# Patient Record
Sex: Male | Born: 2000 | Hispanic: Yes | Marital: Single | Smoking: Never smoker
Health system: Southern US, Community
[De-identification: ages and names within clinical notes are randomized; demographics above are authoritative.]

---
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
## 2021-08-14 ENCOUNTER — Encounter: Payer: Self-pay | Admitting: Emergency Medicine

## 2021-08-14 ENCOUNTER — Other Ambulatory Visit: Payer: Self-pay

## 2021-08-14 ENCOUNTER — Ambulatory Visit
Admission: EM | Admit: 2021-08-14 | Discharge: 2021-08-14 | Disposition: A | Discharge status: Home / Self Care | Payer: Medicaid Other | Attending: Internal Medicine | Admitting: Internal Medicine

## 2021-08-14 DIAGNOSIS — M25571 Pain in right ankle and joints of right foot: Secondary | ICD-10-CM | POA: Diagnosis not present

## 2021-08-14 DIAGNOSIS — M25572 Pain in left ankle and joints of left foot: Secondary | ICD-10-CM

## 2021-08-14 MED ORDER — IBUPROFEN 400 MG PO TABS: 400.0000 mg | ORAL_TABLET | Freq: Four times a day (QID) | ORAL | 0 refills | Status: AC | PRN

## 2021-08-14 NOTE — Discharge Instructions (Addendum)
Rest and elevate the affected painful area.   ?Apply cold compresses intermittently as needed.   ?As pain recedes, begin normal activities slowly as tolerated.   ?Gentle stretching exercises.  Please stretch before and after exercising ?Please get sports shoes with good support ?Call if symptoms persist. ? ?

## 2021-08-14 NOTE — ED Triage Notes (Signed)
Pt here with bilateral ankle pain after slipping in December. No swelling or bruising.  ?

## 2021-08-14 NOTE — ED Provider Notes (Signed)
?[facility] ? ? ? ?CSN: [id number] ?Arrival date & time: 08/14/21  1702 ? ? ?  ? ?History   ?Chief Complaint ?Chief Complaint  ?Patient presents with  ? Ankle Pain  ? ? ?HPI ?Curtis Woodard is a 21 y.o. male comes to the urgent care with 1 month history of bilateral ankle pain.  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  About a month ago he apparently sprained his right ankle.  He has been experiencing intermittent bilateral ankle pain over the past month.  Pain is aggravated by vigorous physical activity like playing soccer.  Pain is relieved and resolves after he completed physical vigorous physical activity.  No swelling of the ankle.  No bruising.  No other joint ache/pain.  No skin rash. ? ?HPI ? ?History reviewed. No pertinent past medical history. ? ?There are no problems to display for this patient. ? ? ?History reviewed. No pertinent surgical history. ? ? ? ? ?Home Medications   ? ?Prior to Admission medications   ?Medication Sig Start Date End Date Taking? Authorizing Provider  ?ibuprofen (ADVIL) 400 MG tablet Take 1 tablet (400 mg total) by mouth every 6 (six) hours as needed. 08/14/21  Yes Rhyder Bratz, Britta Mccreedy, MD  ? ? ?Family History ?Family History  ?Family history unknown: Yes  ? ? ?Social History ?Social History  ? ?Tobacco Use  ? Smoking status: Never  ? Smokeless tobacco: Never  ?Vaping Use  ? Vaping Use: Never used  ?Substance Use Topics  ? Alcohol use: Not Currently  ? Drug use: Not Currently  ? ? ? ?Allergies   ?Patient has no known allergies. ? ? ?Review of Systems ?Review of Systems  ?Constitutional: Negative.   ?HENT: Negative.    ?Musculoskeletal:  Positive for arthralgias. Negative for joint swelling, myalgias and neck pain.  ?Skin: Negative.   ?Neurological: Negative.   ? ? ?Physical Exam ?Triage Vital Signs ?ED Triage Vitals  ?Enc Vitals Group  ?   BP 08/14/21 1732 127/80  ?   Pulse Rate 08/14/21 1732 69  ?   Resp 08/14/21 1732 16  ?   Temp  08/14/21 1732 98.5 ?F (36.9 ?C)  ?   Temp Source 08/14/21 1732 Oral  ?   SpO2 08/14/21 1732 98 %  ?   Weight --   ?   Height --   ?   Head Circumference --   ?   Peak Flow --   ?   Pain Score 08/14/21 1706 5  ?   Pain Loc --   ?   Pain Edu? --   ?   Excl. in GC? --   ? ?No data found. ? ?Updated Vital Signs ?BP 127/80 (BP Location: Left Arm)   Pulse 69   Temp 98.5 ?F (36.9 ?C) (Oral)   Resp 16   SpO2 98%  ? ?Visual Acuity ?Right Eye Distance:   ?Left Eye Distance:   ?Bilateral Distance:   ? ?Right Eye Near:   ?Left Eye Near:    ?Bilateral Near:    ? ?Physical Exam ?Vitals and nursing note reviewed.  ?Constitutional:   ?   Appearance: Normal appearance.  ?Cardiovascular:  ?   Rate and Rhythm: Normal rate and regular rhythm.  ?Musculoskeletal:     ?   General: No swelling, tenderness, deformity or signs of injury. Normal range of motion.  ?Skin: ?   General: Skin is warm.  ?  Findings: No bruising.  ?Neurological:  ?   Mental Status: He is alert.  ? ? ? ?UC Treatments / Results  ?Labs ?(all labs ordered are listed, but only abnormal results are displayed) ?Labs Reviewed - No data to display ? ?EKG ? ? ?Radiology ?No results found. ? ?Procedures ?Procedures (including critical care time) ? ?Medications Ordered in UC ?Medications - No data to display ? ?Initial Impression / Assessment and Plan / UC Course  ?I have reviewed the triage vital signs and the nursing notes. ? ?Pertinent labs & imaging results that were available during my care of the patient were reviewed by me and considered in my medical decision making (see chart for details). ? ?  ? ?1.  Bilateral ankle pain: ?Conservative measures as detailed in the discharge instructions ?Gentle stretching exercises before and after playing sports ?Ibuprofen as needed for pain ?Return to urgent care if symptoms worsen. ?Final Clinical Impressions(s) / UC Diagnoses  ? ?Final diagnoses:  ?Bilateral ankle pain, unspecified chronicity  ? ? ? ?Discharge Instructions    ? ?  ?Rest and elevate the affected painful area.   ?Apply cold compresses intermittently as needed.   ?As pain recedes, begin normal activities slowly as tolerated.   ?Gentle stretching exercises.  Please stretch before and after exercising ?Please get sports shoes with good support ?Call if symptoms persist. ? ? ? ?ED Prescriptions   ? ? Medication Sig Dispense Auth. Provider  ? ibuprofen (ADVIL) 400 MG tablet Take 1 tablet (400 mg total) by mouth every 6 (six) hours as needed. 30 tablet Tammatha Cobb, Britta Mccreedy, MD  ? ?  ? ?PDMP not reviewed this encounter. ?  ?Merrilee Jansky, MD ?08/14/21 1829 ? ?

## 2021-12-24 ENCOUNTER — Ambulatory Visit
Admission: EM | Admit: 2021-12-24 | Discharge: 2021-12-24 | Disposition: A | Discharge status: Home / Self Care | Payer: Medicaid Other | Attending: Family Medicine | Admitting: Family Medicine

## 2021-12-24 DIAGNOSIS — A084 Viral intestinal infection, unspecified: Secondary | ICD-10-CM | POA: Diagnosis not present

## 2021-12-24 MED ORDER — ONDANSETRON HCL 4 MG PO TABS: 4.0000 mg | ORAL_TABLET | Freq: Four times a day (QID) | ORAL | 0 refills | Status: AC

## 2021-12-24 NOTE — ED Provider Notes (Signed)
Curtis Woodard    CSN: [id number] Arrival date & time: 12/24/21  1332      History   Chief Complaint Chief Complaint  Patient presents with   Emesis   Diarrhea   Headache    HPI Curtis Woodard is a 21 y.o. male.   HPI Patient presents today for evaluation of 1 day of nausea with vomiting, upset stomach, headache, and today diarrhea.  Denies any known sick contacts.  He has been fever free. He endorses its been unable to eat some things at work for food exacerbates symptoms.  He is tolerating fluids.He did miss work yesterday and needs a work excuse to be able to return back to work.  Denies any URI symptoms.  History reviewed. No pertinent past medical history.  Patient Active Problem List   Diagnosis Date Noted   Abdominal pain 05/12/2017    History reviewed. No pertinent surgical history.     Home Medications    Prior to Admission medications   Medication Sig Start Date End Date Taking? Authorizing Provider  ondansetron (ZOFRAN) 4 MG tablet Take 1 tablet (4 mg total) by mouth every 6 (six) hours. 12/24/21  Yes Bing Neighbors, FNP  ibuprofen (ADVIL) 400 MG tablet Take 1 tablet (400 mg total) by mouth every 6 (six) hours as needed. 08/14/21   Lamptey, Britta Mccreedy, MD    Family History Family History  Family history unknown: Yes    Social History Social History   Tobacco Use   Smoking status: Never   Smokeless tobacco: Never  Vaping Use   Vaping Use: Never used  Substance Use Topics   Alcohol use: Not Currently   Drug use: Not Currently     Allergies   Patient has no known allergies.  Review of Systems Review of Systems Pertinent negatives listed in HPI   Physical Exam Triage Vital Signs ED Triage Vitals  Enc Vitals Group     BP 12/24/21 1342 118/76     Pulse Rate 12/24/21 1342 76     Resp 12/24/21 1342 16     Temp 12/24/21 1342 97.8 F (36.6 C)     Temp Source 12/24/21 1342 Temporal     SpO2 12/24/21 1342 95 %     Weight  --      Height --      Head Circumference --      Peak Flow --      Pain Score 12/24/21 1341 6     Pain Loc --      Pain Edu? --      Excl. in [place]? --    No data found.  Updated Vital Signs BP 118/76 (BP Location: Left Arm)   Pulse 76   Temp 97.8 F (36.6 C) (Temporal)   Resp 16   SpO2 95%   Visual Acuity Right Eye Distance:   Left Eye Distance:   Bilateral Distance:    Right Eye Near:   Left Eye Near:    Bilateral Near:     Physical Exam Vitals reviewed.  Constitutional:      Appearance: He is well-developed.  HENT:     Head: Normocephalic and atraumatic.     Nose: Nose normal.  Eyes:     Extraocular Movements: Extraocular movements intact.     Pupils: Pupils are equal, round, and reactive to light.  Cardiovascular:     Rate and Rhythm: Normal rate and regular rhythm.  Pulmonary:     Effort: Pulmonary  effort is normal.     Breath sounds: Normal breath sounds.  Abdominal:     General: Bowel sounds are increased. There is no distension.     Palpations: Abdomen is soft.     Tenderness: There is no abdominal tenderness.  Skin:    General: Skin is warm and dry.     Capillary Refill: Capillary refill takes less than 2 seconds.  Neurological:     General: No focal deficit present.     Mental Status: He is alert.  Psychiatric:        Mood and Affect: Mood normal.        Behavior: Behavior normal.      UC Treatments / Results  Labs (all labs ordered are listed, but only abnormal results are displayed) Labs Reviewed - No data to display  EKG   Radiology No results found.  Procedures Procedures (including critical care time)  Medications Ordered in UC Medications - No data to display  Initial Impression / Assessment and Plan / UC Course  I have reviewed the triage vital signs and the nursing notes.  Pertinent labs & imaging results that were available during my care of the patient were reviewed by me and considered in my medical decision making  (see chart for details).    Viral GI illness, symptom management warranted only. Recommended a bland diet.  Zofran prescribed for nausea. Advised to resume regular diet in 24-48 hours.  Work note provided. Return if symptoms worsen or do not  improve. Final Clinical Impressions(s) / UC Diagnoses   Final diagnoses:  Viral gastroenteritis     Discharge Instructions      Take Tylenol or ibuprofen as needed for headache.  Hydrate well with fluids. Recommend bland foods until nausea improves.     ED Prescriptions     Medication Sig Dispense Auth. Provider   ondansetron (ZOFRAN) 4 MG tablet Take 1 tablet (4 mg total) by mouth every 6 (six) hours. 12 tablet Bing Neighbors, FNP      PDMP not reviewed this encounter.   Bing Neighbors, FNP 12/27/21 1538

## 2021-12-24 NOTE — Discharge Instructions (Addendum)
Take Tylenol or ibuprofen as needed for headache.  Hydrate well with fluids. Recommend bland foods until nausea improves.

## 2021-12-24 NOTE — ED Triage Notes (Signed)
Patient presents to Urgent Care with complaints of vomiting, diarrhea, and headache since yesterday. Taking tylenol. States had a burrito Friday unsure if this is what caused his symptoms.

## 2022-07-17 ENCOUNTER — Ambulatory Visit
Admission: EM | Admit: 2022-07-17 | Discharge: 2022-07-17 | Disposition: A | Discharge status: Home / Self Care | Payer: Medicaid Other | Attending: Urgent Care | Admitting: Urgent Care

## 2022-07-17 DIAGNOSIS — R6889 Other general symptoms and signs: Secondary | ICD-10-CM | POA: Diagnosis not present

## 2022-07-17 LAB — POCT RAPID STREP A (OFFICE): Rapid Strep A Screen: NEGATIVE

## 2022-07-17 MED ORDER — OSELTAMIVIR PHOSPHATE 75 MG PO CAPS: 75.0000 mg | ORAL_CAPSULE | Freq: Two times a day (BID) | ORAL | 0 refills | Status: AC

## 2022-07-17 NOTE — ED Triage Notes (Signed)
Patient to Urgent Care with complaints of headaches/ sore throat. Reports chills and body aches throughout the night. Possible fevers last night.  Symptoms started yesterday.

## 2022-07-17 NOTE — Discharge Instructions (Addendum)
You have been diagnosed with a viral upper respiratory infection based on your symptoms and exam. Viral illnesses cannot be treated with antibiotics - they are self limiting - and you should find your symptoms resolving within a few days. Get plenty of rest and non-caffeinated fluids. Watch for signs of dehydration including reduced urine output and dark colored urine.  I have prescribed Tamiflu, antiviral therapy for influenza A, based on a presumptive diagnosis of influenza.     We recommend you use over-the-counter medications for symptom control including acetaminophen (Tylenol), ibuprofen (Advil/Motrin) or naproxen (Aleve) for throat pain, fever, chills or body aches. You may combine use of acetaminophen and ibuprofen/naproxen if needed.  Some patients find an pain-relieving throat spray such as Chloraseptic to be effective.  Also recommend cold/cough medication containing a cough suppressant such as dextromethorphan, as needed.   Saline mist spray is helpful for removing excess mucus from your nose.  Room humidifiers are helpful to ease breathing at night. I recommend guaifenesin (Mucinex) with plenty of water throughout the day to help thin and loosen mucus secretions in your respiratory passages.   If appropriate based upon your other medical problems, you might also find relief of nasal/sinus congestion symptoms by using a nasal decongestant such as fluticasone (Flonase ) or pseudoephedrine (Sudafed sinus).  You will need to obtain Sudafed from behind the pharmacist counter.  Speak to the pharmacist to verify that you are not duplicating medications with other over-the-counter formulations that you may be using.     

## 2022-07-17 NOTE — ED Provider Notes (Signed)
Roderic Palau    CSN: [id number] Arrival date & time: 07/17/22  1413      History   Chief Complaint Chief Complaint  Patient presents with   Headache   Sore Throat    HPI Curtis Woodard is a 22 y.o. male.    Headache Sore Throat Associated symptoms include headaches.    Patient presents to urgent care with complaint of headache and sore throat.  Also reports chills and bodyaches last night.  Endorses fever, undocumented.  No past medical history on file.  Patient Active Problem List   Diagnosis Date Noted   Abdominal pain 05/12/2017    No past surgical history on file.     Home Medications    Prior to Admission medications   Medication Sig Start Date End Date Taking? Authorizing Provider  ibuprofen (ADVIL) 400 MG tablet Take 1 tablet (400 mg total) by mouth every 6 (six) hours as needed. 08/14/21   Lamptey, Myrene Galas, MD  ondansetron (ZOFRAN) 4 MG tablet Take 1 tablet (4 mg total) by mouth every 6 (six) hours. 12/24/21   Scot Jun, NP    Family History Family History  Family history unknown: Yes    Social History Social History   Tobacco Use   Smoking status: Never   Smokeless tobacco: Never  Vaping Use   Vaping Use: Never used  Substance Use Topics   Alcohol use: Not Currently   Drug use: Not Currently     Allergies   Patient has no known allergies.   Review of Systems Review of Systems  Neurological:  Positive for headaches.     Physical Exam Triage Vital Signs ED Triage Vitals  Enc Vitals Group     BP      Pulse      Resp      Temp      Temp src      SpO2      Weight      Height      Head Circumference      Peak Flow      Pain Score      Pain Loc      Pain Edu?      Excl. in [place]?    No data found.  Updated Vital Signs There were no vitals taken for this visit.  Visual Acuity Right Eye Distance:   Left Eye Distance:   Bilateral Distance:    Right Eye Near:   Left Eye Near:    Bilateral  Near:     Physical Exam Vitals reviewed.  Constitutional:      Appearance: He is well-developed. He is ill-appearing.  HENT:     Mouth/Throat:     Pharynx: Posterior oropharyngeal erythema present. No oropharyngeal exudate.  Cardiovascular:     Rate and Rhythm: Normal rate and regular rhythm.     Heart sounds: Normal heart sounds.  Pulmonary:     Effort: Pulmonary effort is normal.     Breath sounds: Normal breath sounds.  Neurological:     Mental Status: He is alert and oriented to person, place, and time.  Psychiatric:        Mood and Affect: Mood normal.        Behavior: Behavior normal.      UC Treatments / Results  Labs (all labs ordered are listed, but only abnormal results are displayed) Labs Reviewed - No data to display  EKG   Radiology No results found.  Procedures Procedures (including critical care time)  Medications Ordered in UC Medications - No data to display  Initial Impression / Assessment and Plan / UC Course  I have reviewed the triage vital signs and the nursing notes.  Pertinent labs & imaging results that were available during my care of the patient were reviewed by me and considered in my medical decision making (see chart for details).   Patient is afebrile here without recent antipyretics. Satting well on room air. Overall is ill appearing, well hydrated, without respiratory distress. Pulmonary exam is unremarkable.  Lungs CTAB without wheezing, rhonchi, rales.  Mild pharyngeal erythema without peritonsillar exudates.  Rapid strep is negative.  Patient's symptoms are most consistent with an acute viral process, possibly including influenza.  Given duration of symptoms are within the treatment window for flu, will treat presumptively.  Otherwise recommending use of OTC medication for symptom control.    Final Clinical Impressions(s) / UC Diagnoses   Final diagnoses:  None   Discharge Instructions   None    ED Prescriptions    None    PDMP not reviewed this encounter.   Rose Phi, [place] 07/17/22 1450

## 2023-07-06 NOTE — Result Encounter Note (Signed)
 Continue same instruction.

## 2023-07-06 NOTE — Progress Notes (Signed)
 Subjective:    Chief Complaint  Patient presents with  . Shoulder Pain    X today  C/o R shoulder blade pain - sharp, worse with ROM. Constant aching  Reports numbness. No tingling  H/o b/l shoulder discoloration - used to play soccer  Denies SOB  Reports pain with deep breathing on shoulder blade     History was provided by the patient. Curtis Woodard is a 23 y.o., male  who presents with history of development of right scapular pain while at work today.  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  Denies any other specific injury, remote or recent, or recent unusual activity.  No complaints of headache or dizziness; no sore throat cough; no chest pain or shortness of breath; no nausea, vomiting, NERA; no fevers or chills. Symptoms include: above   Patient denies: above Treatment to date: above   No past medical history on file. The following portions of the patient's history were reviewed and updated as appropriate: allergies, current medications, past social history, and problem list.  Review of Systems A complete review of systems was performed.  Positive and pertinent negative responses are documented in the HPI, and all other systems are negative.   Objective:     Vitals:   07/06/23 1444  BP: 121/75  Pulse: 68  Temp: 36.7 C (98 F)  TempSrc: Oral  SpO2: 95%  Weight: 64.5 kg (142 lb 3.2 oz)  Height: 175.3 cm (5' 9)  PainSc:   7  PainLoc: Shoulder    General Appearance: Slender.  Pleasant and cooperative.  No acute distress.  Greets with left hand. HEENT:  [name]/AT, PERRLA, EOMI, sclera clear, conjunctivae pink.   Neck:  Supple, no JVD or adenopathy.  Mildly limited right horizontal rotation. Cardiovascular:  Regular rate and rhythm.  Lungs:  Clear to auscultation. Abdomen:  Soft, nontender, nondistended. Normoactive bowel sounds.  Back: No CVA tenderness. Extremities: Right shoulder-no joint deformity.  Limited range of  motion due to pain.  Tender right scapular region.  No rash noted. Neuro:  Alert and orient x4; nonfocal.    Lab/X-ray/Treatments:   C-spine-straightening, spasm, congenital deformity? Right shoulder-unremarkable       Assessment:      1. Cervical radiculopathy -     predniSONE (DELTASONE) 20 MG tablet; Take 1 tablet twice daily for 5 days, then 1 tablet once daily for 5 days.  Take with food.  Dispense: 15 tablet; Refill: 0 -     cyclobenzaprine (FLEXERIL) 10 MG tablet; Take 1 tablet (10 mg total) by mouth 3 (three) times daily as needed for Muscle spasms for up to 10 days  Dispense: 20 tablet; Refill: 0 -     HYDROcodone -acetaminophen  (NORCO) 5-325 mg tablet; Take 1 tablet by mouth every 6 (six) hours as needed for Pain for up to 20 doses  Dispense: 20 tablet; Refill: 0  2. Acute pain of right shoulder -     X-ray cervical spine 2 to 3 views; Future -     X-ray shoulder complete right minimum 2 views; Future   Prednisone, Flexeril, hydrocodone .  Moist heat and rest.  Work note restrictions provided.  Radiology report pending.  Call return if any further problems.    Plan:   Requested Prescriptions   Signed Prescriptions Disp Refills  . predniSONE (DELTASONE) 20 MG tablet 15 tablet 0    Sig: Take 1 tablet twice daily for 5 days, then 1 tablet once daily for 5 days.  Take with food.  . cyclobenzaprine (FLEXERIL) 10 MG tablet 20 tablet 0    Sig: Take 1 tablet (10 mg total) by mouth 3 (three) times daily as needed for Muscle spasms for up to 10 days  . HYDROcodone -acetaminophen  (NORCO) 5-325 mg tablet 20 tablet 0    Sig: Take 1 tablet by mouth every 6 (six) hours as needed for Pain for up to 20 doses

## 2023-07-08 NOTE — Result Encounter Note (Signed)
 Patient returned call for results. Patient is feeling much better and states that the prescribed medications have improved their symptoms. He will call back if anything happens and his symptoms change and worsen or do not improve.

## 2023-07-08 NOTE — Result Encounter Note (Signed)
 Called patient, [facility]

## 2024-01-11 ENCOUNTER — Ambulatory Visit
Admission: EM | Admit: 2024-01-11 | Discharge: 2024-01-11 | Disposition: A | Discharge status: Home / Self Care | Payer: No Typology Code available for payment source

## 2024-01-11 DIAGNOSIS — B079 Viral wart, unspecified: Secondary | ICD-10-CM | POA: Diagnosis not present

## 2024-01-11 NOTE — ED Provider Notes (Signed)
 CAY RALPH PELT    CSN: [id number] Arrival date & time: 01/11/24  1723      History   Chief Complaint Chief Complaint  Patient presents with   Finger Injury    HPI Curtis Woodard is a 23 y.o. male.  Patient presents with a painful sore on his left index finger x 1 month.  He has attempted to remove it using nail clippers without success.  He denies fever, drainage, redness.  The history is provided by the patient and medical records.    History reviewed. No pertinent past medical history.  Patient Active Problem List   Diagnosis Date Noted   Abdominal pain 05/12/2017    History reviewed. No pertinent surgical history.     Home Medications    Prior to Admission medications   Medication Sig Start Date End Date Taking? Authorizing Provider  ibuprofen  (ADVIL ) 400 MG tablet Take 1 tablet (400 mg total) by mouth every 6 (six) hours as needed. Patient not taking: Reported on 01/11/2024 08/14/21   Blaise Aleene KIDD, MD  ondansetron  (ZOFRAN ) 4 MG tablet Take 1 tablet (4 mg total) by mouth every 6 (six) hours. Patient not taking: Reported on 01/11/2024 12/24/21   Arloa Suzen RAMAN, NP  oseltamivir  (TAMIFLU ) 75 MG capsule Take 1 capsule (75 mg total) by mouth every 12 (twelve) hours. Patient not taking: Reported on 01/11/2024 07/17/22   Immordino, Garnette, FNP    Family History Family History  Family history unknown: Yes    Social History Social History   Tobacco Use   Smoking status: Never   Smokeless tobacco: Never  Vaping Use   Vaping status: Never Used  Substance Use Topics   Alcohol use: Not Currently   Drug use: Not Currently     Allergies   Patient has no known allergies.   Review of Systems Review of Systems  Constitutional:  Negative for chills and fever.  Musculoskeletal:  Negative for arthralgias and joint swelling.  Skin:  Positive for wound. Negative for color change.  Neurological:  Negative for weakness and numbness.     Physical  Exam Triage Vital Signs ED Triage Vitals  Encounter Vitals Group     BP 01/11/24 1737 114/75     Girls Systolic BP Percentile --      Girls Diastolic BP Percentile --      Boys Systolic BP Percentile --      Boys Diastolic BP Percentile --      Pulse Rate 01/11/24 1737 70     Resp 01/11/24 1737 18     Temp 01/11/24 1737 98.2 F (36.8 C)     Temp src --      SpO2 01/11/24 1737 98 %     Weight --      Height --      Head Circumference --      Peak Flow --      Pain Score 01/11/24 1754 7     Pain Loc --      Pain Education --      Exclude from Growth Chart --    No data found.  Updated Vital Signs BP 114/75   Pulse 70   Temp 98.2 F (36.8 C)   Resp 18   SpO2 98%   Visual Acuity Right Eye Distance:   Left Eye Distance:   Bilateral Distance:    Right Eye Near:   Left Eye Near:    Bilateral Near:     Physical Exam  Constitutional:      General: He is not in acute distress. HENT:     Mouth/Throat:     Mouth: Mucous membranes are moist.  Cardiovascular:     Rate and Rhythm: Normal rate and regular rhythm.  Pulmonary:     Effort: Pulmonary effort is normal. No respiratory distress.  Musculoskeletal:        General: No deformity. Normal range of motion.  Skin:    General: Skin is warm and dry.     Findings: Lesion present. No erythema.     Comments: Palmar side of left index finger: 4 mm flesh-colored verruca.  No erythema or drainage.  Neurological:     General: No focal deficit present.     Mental Status: He is alert.     Sensory: No sensory deficit.     Motor: No weakness.      UC Treatments / Results  Labs (all labs ordered are listed, but only abnormal results are displayed) Labs Reviewed - No data to display  EKG   Radiology No results found.  Procedures Procedures (including critical care time)  Medications Ordered in UC Medications - No data to display  Initial Impression / Assessment and Plan / UC Course  I have reviewed the triage  vital signs and the nursing notes.  Pertinent labs & imaging results that were available during my care of the patient were reviewed by me and considered in my medical decision making (see chart for details).    Verruca of left index finger.  No indication of infection.  Discussed OTC treatment with product containing salicylic acid.  Education provided on warts.  Instructed patient to follow-up with his PCP or dermatologist if he is not improving.  He agrees to plan of care.  Final Clinical Impressions(s) / UC Diagnoses   Final diagnoses:  Verruca vulgaris     Discharge Instructions      Use an over-the-counter wart treatment that has salicylic acid.  Follow-up with your primary care provider or a dermatologist if not improving.     ED Prescriptions   None    PDMP not reviewed this encounter.   Corlis Burnard DEL, NP 01/11/24 [phone]

## 2024-01-11 NOTE — Discharge Instructions (Addendum)
 Use an over-the-counter wart treatment that has salicylic acid.  Follow-up with your primary care provider or a dermatologist if not improving.

## 2024-01-11 NOTE — ED Triage Notes (Signed)
 Patient to Urgent Care with complaints of left index finger pain and swelling.   Symptoms x1 month. Reports he was clipping the skin with nail clippers and now has an area forming. Reports are is tender. No drainage.
